# Patient Record
Sex: Male | Born: 1995 | Race: Black or African American | Hispanic: No | Marital: Single | Smoking: Never smoker
Health system: Southern US, Community
[De-identification: ages and names within clinical notes are randomized; demographics above are authoritative.]

---

## 2015-03-18 ENCOUNTER — Emergency Department (INDEPENDENT_AMBULATORY_CARE_PROVIDER_SITE_OTHER)
Admission: EM | Admit: 2015-03-18 | Discharge: 2015-03-18 | Disposition: A | Discharge status: Home / Self Care | Payer: BLUE CROSS/BLUE SHIELD | Source: Home / Self Care | Attending: Family Medicine | Admitting: Family Medicine

## 2015-03-18 ENCOUNTER — Emergency Department (HOSPITAL_COMMUNITY): Payer: BLUE CROSS/BLUE SHIELD

## 2015-03-18 ENCOUNTER — Encounter (HOSPITAL_COMMUNITY): Payer: Self-pay | Admitting: *Deleted

## 2015-03-18 ENCOUNTER — Emergency Department (HOSPITAL_COMMUNITY)
Admission: EM | Admit: 2015-03-18 | Discharge: 2015-03-18 | Disposition: A | Discharge status: Home / Self Care | Payer: BLUE CROSS/BLUE SHIELD | Attending: Emergency Medicine | Admitting: Emergency Medicine

## 2015-03-18 DIAGNOSIS — I861 Scrotal varices: Secondary | ICD-10-CM | POA: Diagnosis not present

## 2015-03-18 DIAGNOSIS — N433 Hydrocele, unspecified: Secondary | ICD-10-CM | POA: Diagnosis not present

## 2015-03-18 DIAGNOSIS — K4090 Unilateral inguinal hernia, without obstruction or gangrene, not specified as recurrent: Secondary | ICD-10-CM | POA: Diagnosis not present

## 2015-03-18 DIAGNOSIS — R1031 Right lower quadrant pain: Secondary | ICD-10-CM | POA: Diagnosis present

## 2015-03-18 LAB — CBC WITH DIFFERENTIAL/PLATELET
Basophils Absolute: 0.1 10*3/uL (ref 0.0–0.1)
Basophils Relative: 0 %
Eosinophils Absolute: 0.1 10*3/uL (ref 0.0–0.7)
Eosinophils Relative: 1 %
HCT: 41.9 % (ref 39.0–52.0)
Hemoglobin: 14.6 g/dL (ref 13.0–17.0)
Lymphocytes Relative: 19 %
Lymphs Abs: 2.4 10*3/uL (ref 0.7–4.0)
MCH: 32.1 pg (ref 26.0–34.0)
MCHC: 34.8 g/dL (ref 30.0–36.0)
MCV: 92.1 fL (ref 78.0–100.0)
Monocytes Absolute: 1 10*3/uL (ref 0.1–1.0)
Monocytes Relative: 8 %
Neutro Abs: 9 10*3/uL — ABNORMAL HIGH (ref 1.7–7.7)
Neutrophils Relative %: 72 %
Platelets: 213 10*3/uL (ref 150–400)
RBC: 4.55 MIL/uL (ref 4.22–5.81)
RDW: 12.5 % (ref 11.5–15.5)
WBC: 12.5 10*3/uL — ABNORMAL HIGH (ref 4.0–10.5)

## 2015-03-18 LAB — BASIC METABOLIC PANEL
Anion gap: 11 (ref 5–15)
BUN: 11 mg/dL (ref 6–20)
CO2: 28 mmol/L (ref 22–32)
Calcium: 9.5 mg/dL (ref 8.9–10.3)
Chloride: 102 mmol/L (ref 101–111)
Creatinine, Ser: 1.09 mg/dL (ref 0.61–1.24)
GFR calc Af Amer: >60 mL/min (ref >60)
GFR calc non Af Amer: >60 mL/min (ref >60)
Glucose, Bld: 92 mg/dL (ref 65–99)
Potassium: 3.7 mmol/L (ref 3.5–5.1)
Sodium: 141 mmol/L (ref 135–145)

## 2015-03-18 MED ORDER — DIAZEPAM 5 MG/ML IJ SOLN
5.0000 mg | Freq: Once | INTRAMUSCULAR | Status: AC
  Administered 2015-03-18: 5 mg via INTRAVENOUS
  Filled 2015-03-18: qty 2

## 2015-03-18 MED ORDER — IBUPROFEN 600 MG PO TABS: 600.0000 mg | ORAL_TABLET | Freq: Four times a day (QID) | ORAL | Status: AC | PRN

## 2015-03-18 MED ORDER — KETOROLAC TROMETHAMINE 15 MG/ML IJ SOLN
15.0000 mg | Freq: Once | INTRAMUSCULAR | Status: AC
  Administered 2015-03-18: 15 mg via INTRAVENOUS
  Filled 2015-03-18: qty 1

## 2015-03-18 MED ORDER — OXYCODONE-ACETAMINOPHEN 5-325 MG PO TABS: 1.0000 | ORAL_TABLET | ORAL | Status: DC | PRN

## 2015-03-18 MED ORDER — MORPHINE SULFATE (PF) 4 MG/ML IV SOLN
6.0000 mg | Freq: Once | INTRAVENOUS | Status: AC
  Administered 2015-03-18: 6 mg via INTRAVENOUS
  Filled 2015-03-18: qty 2

## 2015-03-18 NOTE — ED Notes (Signed)
NAD at this time. Pt is stable and going home.  

## 2015-03-18 NOTE — ED Notes (Signed)
Pt has right groin hernia and it was confirmed at urgent care.  Pt state pain for one week.  Pt sent down here for possible surgical consult

## 2015-03-18 NOTE — Discharge Instructions (Signed)
Hydrocele, Adult A hydrocele is a collection of fluid in the loose pouch of skin that holds the testicles (scrotum). Usually, it affects only one testicle. CAUSES This condition may be caused by:  An injury to the scrotum.  An infection.  A tumor or cancer of the testicle.  Twisting of a testicle.  Decreased blood flow to the scrotum. SYMPTOMS A hydrocele feels like a water-filled balloon. It may also feel heavy. A hydrocele can cause:  Swelling of the scrotum. The swelling may decrease when you lie down.  Swelling of the groin.  Mild discomfort in the scrotum.  Pain. This can develop if the hydrocele was caused by infection or twisting. DIAGNOSIS This condition may be diagnosed with a medical history, physical exam, and imaging tests. You may also have blood and urine tests to check for infection. TREATMENT Treatment may include:  Watching and waiting, particularly if the hydrocele causes no symptoms.  Treatment of the underlying condition. This may include using antibiotic medicine.  Surgery to drain the fluid. Some surgical options include:  Needle aspiration. For this procedure, a needle is used to drain fluid.  Hydrocelectomy. For this procedure, an incision is made in the scrotum to remove the fluid sac. HOME CARE INSTRUCTIONS  Keep all follow-up visits as told by your health care provider. This is important.  Watch the hydrocele for any changes.  Take over-the-counter and prescription medicines only as told by your health care provider.  If you were prescribed an antibiotic medicine, use it as told by your health care provider. Do not stop using the antibiotic even if your condition improves. SEEK MEDICAL CARE IF:  The swelling in your scrotum or groin gets worse.  The hydrocele becomes red, firm, tender to the touch, or painful.  You notice any changes in the hydrocele.  You have a fever.   This information is not intended to replace advice given to  you by your health care provider. Make sure you discuss any questions you have with your health care provider.   Document Released: 11/16/2009 Document Revised: 10/13/2014 Document Reviewed: 05/25/2014 Elsevier Interactive Patient Education 2016 [employer]  Scrotal Swelling Scrotal swelling may occur on one or both sides of the scrotum. Pain may also occur with swelling. Possible causes of scrotal swelling include:   Injury.  Infection.  An ingrown hair or abrasion in the area.  Repeated rubbing from tight-fitting underwear.  Poor hygiene.  A weakened area in the muscles around the groin (hernia). A hernia can allow abdominal contents to push into the scrotum.  Fluid around the testicle (hydrocele).  Enlarged vein around the testicle (varicocele).  Certain medical treatments or existing conditions.  A recent genital surgery or procedure.  The spermatic cord becomes twisted in the scrotum, which cuts off blood supply (testicular torsion).  Testicular cancer. HOME CARE INSTRUCTIONS Once the cause of your scrotal swelling has been determined, you may be asked to monitor your scrotum for any changes. The following actions may help to alleviate any discomfort you are experiencing:  Rest and limit activity until the swelling goes away. Lying down is the preferred position.  Put ice on the scrotum:  Put ice in a plastic bag.  Place a towel between your skin and the bag.  Leave the ice on for 20 minutes, 2-3 times a day for 1-2 days.  Place a rolled towel under the testicles for support.  Wear loose-fitting clothing or an athletic support cup for comfort.  Take all medicines  as directed by your health care provider.  Perform a monthly self-exam of the scrotum and penis. Feel for changes. Ask your health care provider how to perform a monthly self-exam if you are unsure. SEEK MEDICAL CARE IF:  You have a sudden (acute) onset of pain that is persistent and not  improving.  You notice a heavy feeling or fluid in the scrotum.  You have pain or burning while urinating.  You have blood in the urine or semen.  You feel a lump around the testicle.  You notice that one testicle is larger than the other (slight variation is normal).  You have a persistent dull ache or pain in the groin or scrotum. SEEK IMMEDIATE MEDICAL CARE IF:  The pain does not go away or becomes severe.  You have a fever or shaking chills.  You have pain or vomiting that cannot be controlled.  You notice significant redness or swelling of one or both sides of the scrotum.  You experience redness spreading upward from your scrotum to your abdomen or downward from your scrotum to your thighs. MAKE SURE YOU:  Understand these instructions.  Will watch your condition.  Will get help right away if you are not doing well or get worse.   This information is not intended to replace advice given to you by your health care provider. Make sure you discuss any questions you have with your health care provider.   Document Released: 07/01/2010 Document Revised: 01/29/2013 Document Reviewed: 10/31/2012 Elsevier Interactive Patient Education [employer]  Varicocele A varicocele is a swelling of veins in the scrotum. The scrotum is the sac that contains the testicles. Varicoceles can occur on either side of the scrotum, but they are more common on the left side. They occur most often in teenage boys and young men. In most cases, varicoceles are not a serious problem. They are usually small and painless and do not require treatment. Tests may be done to confirm the diagnosis. Treatment may be needed if:  A varicocele is large, causes a lot of pain, or causes pain when exercising.  Varicoceles are found on both sides of the scrotum.  The testicle on the opposite side is absent or not normal.  A varicocele causes a decrease in the size of the testicle in a growing  adolescent.  The person has fertility problems. CAUSES This condition is the result of valves in the veins not working properly. Valves in the veins help to return blood from the scrotum and testicles to the heart. If these valves do not work well, blood flows backward and backs up into the veins, which causes the veins to swell. This is similar to what happens when varicose veins form in the leg. SYMPTOMS Most varicoceles do not cause any symptoms. If symptoms do occur, they may include:  Swelling on one side of the scrotum. The swelling may be more obvious when you are standing up.  A lumpy feeling in the scrotum.  A heavy feeling on one side of the scrotum.  A dull ache in the scrotum, especially after exercise or prolonged standing or sitting.  Slower growth or reduced size of the testicle on the side of the varicocele (in young males).  Problems with fertility. These can occur if the testicle does not grow normally. DIAGNOSIS This condition may be diagnosed with a physical exam. You may also have an imaging test, called an ultrasound, to confirm the diagnosis and to help rule out other causes  of the swelling. TREATMENT Treatment is usually not needed for this condition. If you have any pain, your health care provider may prescribe or recommend medicine to help relieve it. You may need regular exams so your health care provider can monitor the varicocele to ensure that it does not cause problems. When further treatment is needed, it may involve one of these options:  Varicocelectomy. This is a surgery in which the swollen veins are tied off so that the flow of blood goes to other veins instead.  Embolization. In this procedure, a small tube (catheter) is used to place metal coils or other blocking items in the veins. This cuts off the blood flow to the swollen veins. HOME CARE INSTRUCTIONS  Take medicines only as directed by your health care provider.  Wear supportive  underwear.  Use an athletic supporter for sports.  Keep all follow-up visits as directed by your health care provider. This is important. SEEK MEDICAL CARE IF:  Your pain is increasing.  You have redness in the affected area.  You have swelling that does not decrease when you are lying down.  One of your testicles is smaller than the other.  Your testicle becomes enlarged, swollen, or painful.   This information is not intended to replace advice given to you by your health care provider. Make sure you discuss any questions you have with your health care provider.   Document Released: 09/04/2000 Document Revised: 10/13/2014 Document Reviewed: 05/06/2014 Elsevier Interactive Patient Education [employer]

## 2015-03-18 NOTE — ED Notes (Signed)
Pt reports right groin pain the past week.  He usually lifts weights and thinks he may have injured himself recently.   He denies urinary problems

## 2015-03-18 NOTE — ED Provider Notes (Signed)
CSN: [id number]     Arrival date & [id number]  1559 History   First MD Initiated Contact with Patient 03/18/15 1611     Chief Complaint  Patient presents with  . Inguinal Hernia     (Consider location/radiation/quality/duration/timing/severity/associated sxs/prior Treatment) HPI  19 year old male with right to this particular/right inguinal pain. Onset about a week ago. Progressively worsening. Patient was seen in urgent care prior to arrival referred to the emergency room. His pain at rest which is worse with movement and coughing. No urinary complaints. No fevers or chills. No history of similar type symptoms. Has tried taking ibuprofen with minimal relief.  History reviewed. No pertinent past medical history. History reviewed. No pertinent past surgical history. No family history on file. Social History  Substance Use Topics  . Smoking status: Never Smoker   . Smokeless tobacco: Never Used  . Alcohol Use: No    Review of Systems  All systems reviewed and negative, other than as noted in HPI.   Allergies  Review of patient's allergies indicates no known allergies.  Home Medications   Prior to Admission medications   Not on File   BP 156/87 mmHg  Pulse 64  Temp(Src) 98.3 F (36.8 C) (Oral)  Resp 18  SpO2 100% Physical Exam  Constitutional: He appears well-developed and well-nourished. No distress.  HENT:  Head: Normocephalic and atraumatic.  Eyes: Conjunctivae are normal. Right eye exhibits no discharge. Left eye exhibits no discharge.  Neck: Neck supple.  Cardiovascular: Normal rate, regular rhythm and normal heart sounds.  Exam reveals no gallop and no friction rub.   No murmur heard. Pulmonary/Chest: Effort normal and breath sounds normal. No respiratory distress.  Abdominal: Soft. He exhibits no distension. There is no tenderness.  Genitourinary:  Fullness of the right hemiscrotum is tenderness to palpation. Tenderness along the right inguinal canal. The  right lower quadrant tenderness. No discharge. No inguinal adenopathy.  Musculoskeletal: He exhibits no edema or tenderness.  Neurological: He is alert.  Skin: Skin is warm and dry.  Psychiatric: He has a normal mood and affect. His behavior is normal. Thought content normal.  Nursing note and vitals reviewed.   ED Course  Procedures (including critical care time) Labs Review Labs Reviewed  CBC WITH DIFFERENTIAL/PLATELET - Abnormal; Notable for the following:    WBC 12.5 (*)    Neutro Abs 9.0 (*)    All other components within normal limits  BASIC METABOLIC PANEL  URINALYSIS, ROUTINE W REFLEX MICROSCOPIC (NOT AT [facility])    Imaging Review US Scrotum  03/18/2015   CLINICAL DATA:  Right testicular pain for 3-4 days, swelling.  EXAM: ULTRASOUND OF SCROTUM  TECHNIQUE: Complete ultrasound examination of the testicles, epididymis, and other scrotal structures was performed.  COMPARISON:  None.  FINDINGS: Right testicle  Measurements: 3.6 x 2.1 x 2.8 cm. No mass or microlithiasis visualized.  Left testicle  Measurements: 3.4 x 2 x 2.3 cm. No mass or microlithiasis visualized.  Right epididymis:  Normal in size and appearance.  Left epididymis:  Normal in size and appearance.  Hydrocele: Moderate-sized right hydrocele. No left-sided hydrocele.  Varicocele:  Large right-sided varicocele.  IMPRESSION: 1. Large right-sided varicocele. 2. Moderate-sized right hydrocele. 3. Both testicles appear normal. No evidence of testicular torsion or orchitis. 4. No evidence of epididymitis.   Electronically Signed   By: Bary Richard M.D.   On: 03/18/2015 18:00   I have personally reviewed and evaluated these images and lab results as part of my  medical decision-making.   EKG Interpretation None      MDM   Final diagnoses:  Right varicocele  Hydrocele, right    19 year old male with right inguinal/testicular pain. Seen at [facility] prior to arrival and sent to ED for irreducible R inguinal hernia. He has a  fullness along the right inguinal crease. He is extremely apprehensive on exam. R testicle seems high and very tender. No obstructive symptoms. No concerning overlying skin changes. Will place IV for pain medication and anxiolysis attempt to reexamine. Consult surgery.  Surgery seen pt. Concerned for possible testicular torsion. Will [place].   Ultrasensitive significant for right-sided hydrocele and varicocele. Workup otherwise unremarkable. Treatment. Urology follow-up.    Raeford Razor, MD 03/25/15 1052

## 2015-03-18 NOTE — ED Provider Notes (Signed)
CSN: [id number]     Arrival date & time 03/18/15  1421 History   First MD Initiated Contact with Patient 03/18/15 1515     Chief Complaint  Patient presents with  . Hernia   (Consider location/radiation/quality/duration/timing/severity/associated sxs/prior Treatment) HPI  Dead lifting when pt noticed an acute onset RLQ pain w/ radiation to R testicle. Minimal pain at first but became progressively stronger. Enlarged R testicle/mass. Occurred 7 days ago.RLQ w/ radiation to R testicle. No previous testicular pain or hernia history. 400 mg Advil with minimal improvement. Pain is getting worse. Change in bowel habits. Patient had an uncle with a hernia but no other hernia history. Patient has not been any further weight lifting since onset of symptoms.    History reviewed. No pertinent past medical history. History reviewed. No pertinent past surgical history. No family history on file. Social History  Substance Use Topics  . Smoking status: Never Smoker   . Smokeless tobacco: Never Used  . Alcohol Use: No    Review of Systems Per HPI with all other pertinent systems negative.   Allergies  Review of patient's allergies indicates no known allergies.  Home Medications   Prior to Admission medications   Not on File   Meds Ordered and Administered this Visit  Medications - No data to display  BP 148/96 mmHg  Pulse 65  Temp(Src) 99 F (37.2 C) (Oral)  Resp 16  SpO2 100% No data found.   Physical Exam Physical Exam  Constitutional: oriented to person, place, and time. appears well-developed and well-nourished. No distress.  HENT:  Head: Normocephalic and atraumatic.  Eyes: EOMI. PERRL.  Neck: Normal range of motion.  Cardiovascular: RRR, no m/r/g, 2+ distal pulses,  Pulmonary/Chest: Effort normal and breath sounds normal. No respiratory distress.  Abdominal: Soft. Bowel sounds are normal. NonTTP, no distension.  Musculoskeletal: Normal range of motion. Non ttp, no  effusion.  Neurological: alert and oriented to person, place, and time.  Skin: Skin is warm. No rash noted. non diaphoretic.  Psychiatric: normal mood and affect. behavior is normal. Judgment and thought content normal.  GU:  Right testicle and inguinal canal with large mass likely loops of bowel that are nonreducible. Tender to palpation.  ED Course  Procedures (including critical care time)  Labs Review Labs Reviewed - No data to display  Imaging Review No results found.   Visual Acuity Review  Right Eye Distance:   Left Eye Distance:   Bilateral Distance:    Right Eye Near:   Left Eye Near:    Bilateral Near:         MDM   1. Right inguinal hernia    Nonreducible right inguinal hernia. Problem is constant and getting worse. Do not suspect bowel is ischemic at this point time as it has been ongoing for a week but believe patient needs further evaluation in the emergency room and possible evaluation for surgical repair. Discussed case with on-call surgical PA Barnetta Chapel who agrees with sending patient to the ED for further evaluation.    Ozella Rocks, MD 03/18/15 [phone]

## 2015-03-18 NOTE — Consult Note (Signed)
Reason for Consult:Right groin pain Referring Physician: Juleen China MD   Robert Cooke is an 19 y.o. male.  HPI: 3 - 4 DAY history of right groin / testicle pain that worsened today and is now severe.  Hurts to push on right testicle and along the cord on the left.  Says he had had some swelling for the last 5 days or so.  No change in bowel or bladder function.    History reviewed. No pertinent past medical history.  History reviewed. No pertinent past surgical history.  No family history on file.  Social History:  reports that he has never smoked. He has never used smokeless tobacco. He reports that he does not drink alcohol or use illicit drugs.  Allergies: No Known Allergies  Medications: I have reviewed the patient's current medications.  No results found for this or any previous visit (from the past 48 hour(s)).  No results found.  Review of Systems  Constitutional: Negative for fever and chills.  HENT: Negative.   Eyes: Negative.   Respiratory: Negative.   Cardiovascular: Negative.   Gastrointestinal: Negative.   Musculoskeletal: Negative.   Skin: Negative.   Neurological: Negative.   Psychiatric/Behavioral: Negative.    Blood pressure 156/87, pulse 64, temperature 98.3 F (36.8 C), temperature source Oral, resp. rate 18, SpO2 100 %. Physical Exam  Constitutional: He appears well-developed and well-nourished.  HENT:  Head: Normocephalic and atraumatic.  Eyes: Pupils are equal, round, and reactive to light.  Neck: Normal range of motion.  Cardiovascular: Normal rate.   Respiratory: Effort normal.  GI: Soft. He exhibits no distension. There is no tenderness. Hernia confirmed negative in the right inguinal area and confirmed negative in the left inguinal area.  Genitourinary: Penis normal.    Right testis shows swelling and tenderness. Left testis shows no mass, no swelling and no tenderness. Left testis is descended. Cremasteric reflex is not absent on the  left side. Circumcised.  Lymphadenopathy:       Right: No inguinal adenopathy present.       Left: No inguinal adenopathy present.    Assessment/Plan: Right testicular pain/ swelling  Recommend U/S to exclude torsion since right testicle seem high than the left.  I don't feel a large incarcerated hernia on exam.  Very painful around right cord structures.  May need CT to sort out if U/S unrevealing.    Robert Cooke A. 03/18/2015, 4:45 PM

## 2015-03-30 ENCOUNTER — Encounter (HOSPITAL_COMMUNITY): Payer: Self-pay | Admitting: Emergency Medicine

## 2015-03-30 ENCOUNTER — Emergency Department (HOSPITAL_COMMUNITY): Payer: BLUE CROSS/BLUE SHIELD

## 2015-03-30 ENCOUNTER — Emergency Department (HOSPITAL_COMMUNITY)
Admission: EM | Admit: 2015-03-30 | Discharge: 2015-03-30 | Disposition: A | Discharge status: Home / Self Care | Payer: BLUE CROSS/BLUE SHIELD | Attending: Emergency Medicine | Admitting: Emergency Medicine

## 2015-03-30 DIAGNOSIS — I861 Scrotal varices: Secondary | ICD-10-CM | POA: Diagnosis not present

## 2015-03-30 DIAGNOSIS — N50811 Right testicular pain: Secondary | ICD-10-CM | POA: Diagnosis present

## 2015-03-30 DIAGNOSIS — N451 Epididymitis: Secondary | ICD-10-CM

## 2015-03-30 DIAGNOSIS — Z791 Long term (current) use of non-steroidal anti-inflammatories (NSAID): Secondary | ICD-10-CM | POA: Diagnosis not present

## 2015-03-30 DIAGNOSIS — N50819 Testicular pain, unspecified: Secondary | ICD-10-CM

## 2015-03-30 MED ORDER — OXYCODONE HCL 5 MG PO TABS
5.0000 mg | ORAL_TABLET | Freq: Once | ORAL | Status: AC
  Administered 2015-03-30: 5 mg via ORAL
  Filled 2015-03-30: qty 1

## 2015-03-30 MED ORDER — IBUPROFEN 800 MG PO TABS
800.0000 mg | ORAL_TABLET | Freq: Once | ORAL | Status: AC
  Administered 2015-03-30: 800 mg via ORAL
  Filled 2015-03-30: qty 1

## 2015-03-30 MED ORDER — OXYCODONE HCL 5 MG PO TABS: 5.0000 mg | ORAL_TABLET | ORAL | Status: AC | PRN

## 2015-03-30 MED ORDER — ACETAMINOPHEN 500 MG PO TABS
1000.0000 mg | ORAL_TABLET | Freq: Once | ORAL | Status: AC
  Administered 2015-03-30: 1000 mg via ORAL
  Filled 2015-03-30: qty 2

## 2015-03-30 MED ORDER — CIPROFLOXACIN HCL 500 MG PO TABS: 500.0000 mg | ORAL_TABLET | Freq: Two times a day (BID) | ORAL | Status: AC

## 2015-03-30 NOTE — ED Notes (Signed)
Pt returned from US, pain decreased

## 2015-03-30 NOTE — ED Provider Notes (Signed)
CSN: [id number]     Arrival date & time 03/30/15  1134 History   First MD Initiated Contact with Patient 03/30/15 1259     Chief Complaint  Patient presents with  . Testicle Pain     (Consider location/radiation/quality/duration/timing/severity/associated sxs/prior Treatment) Patient is a 19 y.o. male presenting with male genitourinary complaint. The history is provided by the patient.  Male GU Problem Presenting symptoms: scrotal pain   Context: spontaneously   Relieved by:  Nothing Worsened by:  Nothing tried Ineffective treatments:  None tried Associated symptoms: scrotal swelling   Associated symptoms: no abdominal pain, no diarrhea, no fever and no vomiting     19 yo M  With a chief complaint of right testicular swelling. This been going on for at least 3 weeks. Patient was seen a couple weeks ago in the emergency department with the testicular ultrasound concerning for a  Varicocele,  discharge home with urology follow-up. patient has continued to have pain has been wearing compressive briefs as well as taking narcotics and ibuprofen with minimal relief. Patient is running out of his medicine in the family is concerned about continued pain. follow-up with urology later this month.  .History reviewed. No pertinent past medical history. History reviewed. No pertinent past surgical history. History reviewed. No pertinent family history. Social History  Substance Use Topics  . Smoking status: Never Smoker   . Smokeless tobacco: Never Used  . Alcohol Use: No    Review of Systems  Constitutional: Negative for fever and chills.  HENT: Negative for congestion and facial swelling.   Eyes: Negative for discharge and visual disturbance.  Respiratory: Negative for shortness of breath.   Cardiovascular: Negative for chest pain and palpitations.  Gastrointestinal: Negative for vomiting, abdominal pain and diarrhea.  Genitourinary: Positive for scrotal swelling and testicular pain.   Musculoskeletal: Negative for myalgias and arthralgias.  Skin: Negative for color change and rash.  Neurological: Negative for tremors, syncope and headaches.  Psychiatric/Behavioral: Negative for confusion and dysphoric mood.      Allergies  Review of patient's allergies indicates no known allergies.  Home Medications   Prior to Admission medications   Medication Sig Start Date End Date Taking? Authorizing Provider  ibuprofen (ADVIL,MOTRIN) 600 MG tablet Take 1 tablet (600 mg total) by mouth every 6 (six) hours as needed. 03/18/15  Yes Raeford Razor, MD  ciprofloxacin (CIPRO) 500 MG tablet Take 1 tablet (500 mg total) by mouth 2 (two) times daily. 03/30/15   Melene Plan, DO  oxyCODONE (ROXICODONE) 5 MG immediate release tablet Take 1 tablet (5 mg total) by mouth every 4 (four) hours as needed for severe pain. 03/30/15   Melene Plan, DO   BP 140/93 mmHg  Pulse 76  Temp(Src) 98.4 F (36.9 C) (Oral)  Resp 16  Ht  (1.803 m)  Wt 177 lb 12.8 oz (80.65 kg)  BMI 24.81 kg/m2  SpO2 100% Physical Exam  Constitutional: He is oriented to person, place, and time. He appears well-developed and well-nourished.  HENT:  Head: Normocephalic and atraumatic.  Eyes: EOM are normal. Pupils are equal, round, and reactive to light.  Neck: Normal range of motion. Neck supple. No JVD present.  Cardiovascular: Normal rate and regular rhythm.  Exam reveals no gallop and no friction rub.   No murmur heard. Pulmonary/Chest: No respiratory distress. He has no wheezes.  Abdominal: He exhibits no distension. There is no tenderness. There is no rebound and no guarding.  Genitourinary: Right testis shows swelling and  tenderness. Cremasteric reflex is absent on the right side. Cremasteric reflex is not absent on the left side. Circumcised. No penile tenderness. No discharge found.   Significant swelling and abnormal lie of the right testicle.  No noted cremasteric reflex on that side  Musculoskeletal: Normal  range of motion.  Neurological: He is alert and oriented to person, place, and time.  Skin: No rash noted. No pallor.  Psychiatric: He has a normal mood and affect. His behavior is normal.  Nursing note and vitals reviewed.   ED Course  Procedures (including critical care time) Labs Review Labs Reviewed  URINALYSIS, ROUTINE W REFLEX MICROSCOPIC (NOT AT [facility])  GC/CHLAMYDIA PROBE AMP ([place]) NOT AT [facility]    Imaging Review US Scrotum  03/30/2015  CLINICAL DATA:  Right testicular pain and swelling 2 weeks EXAM: SCROTAL ULTRASOUND DOPPLER ULTRASOUND OF THE TESTICLES TECHNIQUE: Complete ultrasound examination of the testicles, epididymis, and other scrotal structures was performed. Color and spectral Doppler ultrasound were also utilized to evaluate blood flow to the testicles. COMPARISON:  Scrotal ultrasound 03/18/2015 FINDINGS: Right testicle Measurements: 3.4 x 2.5 x 2.7 cm. No mass or microlithiasis visualized. Left testicle Measurements: 3.8 x 1.6 x 2.8 cm. No mass or microlithiasis visualized. Right epididymis: The right epididymis is difficult to separate from a large soft tissue abnormality extending up to the inguinal canal. This is felt to be most likely related to varicocele. This shows hypervascularity but does not change significantly on Valsalva. Epididymal infection or tumor considered less likely. Left epididymis:  Normal in size and appearance. Hydrocele: Large septated hydrocele on the right has enlarged in the interval. Varicocele: Hypervascular mass in the right scrotum above the testicle extending into the inguinal canal. This shows serpiginous enlarged vessels but does not change significantly with Valsalva. This most likely is a varicocele. Hypervascular inflammatory or neoplastic mass of the epididymis considered less likely. Similar appearance to the prior ultrasound. Correlate with physical exam. Pulsed Doppler interrogation of both testes demonstrates normal low resistance  arterial and venous waveforms bilaterally. IMPRESSION: Progression of hydrocele on the right. Negative for testicular torsion Soft tissue thickening above the right testicle extending into the inguinal canal shows hypervascularity and prominent enlarged vessels but does not change significantly with Valsalva. This may represent varicocele possibly with partial thrombosis and tissue edema. Epididymal infection or tumor considered less likely. Electronically Signed   By: Marlan Palau M.D.   On: 03/30/2015 14:46   [place] Art/ven Flow Abd Pelv Doppler  03/30/2015  CLINICAL DATA:  Right testicular pain and swelling 2 weeks EXAM: SCROTAL ULTRASOUND DOPPLER ULTRASOUND OF THE TESTICLES TECHNIQUE: Complete ultrasound examination of the testicles, epididymis, and other scrotal structures was performed. Color and spectral Doppler ultrasound were also utilized to evaluate blood flow to the testicles. COMPARISON:  Scrotal ultrasound 03/18/2015 FINDINGS: Right testicle Measurements: 3.4 x 2.5 x 2.7 cm. No mass or microlithiasis visualized. Left testicle Measurements: 3.8 x 1.6 x 2.8 cm. No mass or microlithiasis visualized. Right epididymis: The right epididymis is difficult to separate from a large soft tissue abnormality extending up to the inguinal canal. This is felt to be most likely related to varicocele. This shows hypervascularity but does not change significantly on Valsalva. Epididymal infection or tumor considered less likely. Left epididymis:  Normal in size and appearance. Hydrocele: Large septated hydrocele on the right has enlarged in the interval. Varicocele: Hypervascular mass in the right scrotum above the testicle extending into the inguinal canal. This shows serpiginous enlarged vessels but does  not change significantly with Valsalva. This most likely is a varicocele. Hypervascular inflammatory or neoplastic mass of the epididymis considered less likely. Similar appearance to the prior ultrasound. Correlate  with physical exam. Pulsed Doppler interrogation of both testes demonstrates normal low resistance arterial and venous waveforms bilaterally. IMPRESSION: Progression of hydrocele on the right. Negative for testicular torsion Soft tissue thickening above the right testicle extending into the inguinal canal shows hypervascularity and prominent enlarged vessels but does not change significantly with Valsalva. This may represent varicocele possibly with partial thrombosis and tissue edema. Epididymal infection or tumor considered less likely. Electronically Signed   By: Marlan Palauharles  Clark M.D.   On: 03/30/2015 14:46   I have personally reviewed and evaluated these images and lab results as part of my medical decision-making.   EKG Interpretation None      MDM   Final diagnoses:  Testicular pain  Varicocele  Epididymitis    19 yo M,  With a chief complaint of right testicular pain.  Exam concerning for possible torsion will reultrasound.    ultrasound negative for torsion. Persistent varicocele as well as increasing hydrocele.  Also some concern for possible clot. Discussed these results with Dr. Marlou PorchHerrick,  Urology. Concern this may be epididymitis. Will treat with Cipro for 2 weeks. Have the patient call on Friday , if not improved stable see him in the office.  Discussed  With patient concerns for possible  Tendon rupture. Will abstain from athletics or weightlifting during antibiotic use.   I have discussed the diagnosis/risks/treatment options with the patient and family and believe the pt to be eligible for discharge home to follow-up with Urologist. We also discussed returning to the ED immediately if new or worsening sx occur. We discussed the sx which are most concerning (e.g., sudden worsening pain, fever, inability to tolerate by mouth) that necessitate immediate return. Medications administered to the patient during their visit and any new prescriptions provided to the patient are listed  below.  Medications given during this visit Medications  acetaminophen (TYLENOL) tablet 1,000 mg (1,000 mg Oral Given 03/30/15 1323)  ibuprofen (ADVIL,MOTRIN) tablet 800 mg (800 mg Oral Given 03/30/15 1323)  oxyCODONE (Oxy IR/ROXICODONE) immediate release tablet 5 mg (5 mg Oral Given 03/30/15 1323)    Discharge Medication List as of 03/30/2015  3:17 PM    START taking these medications   Details  ciprofloxacin (CIPRO) 500 MG tablet Take 1 tablet (500 mg total) by mouth 2 (two) times daily., Starting 03/30/2015, Until Discontinued, Print    oxyCODONE (ROXICODONE) 5 MG immediate release tablet Take 1 tablet (5 mg total) by mouth every 4 (four) hours as needed for severe pain., Starting 03/30/2015, Until Discontinued, Print        The patient appears reasonably screen and/or stabilized for discharge and I doubt any other medical condition or other [facility] requiring further screening, evaluation, or treatment in the ED at this time prior to discharge.     Melene Planan Alianah Lofton, DO 03/30/15 1728

## 2015-03-30 NOTE — ED Notes (Signed)
Patient transported to Ultrasound 

## 2015-03-30 NOTE — ED Notes (Signed)
Pt sts right testicle pain and was diagnosed with hydrocele and varicocele; pt sts pain x 3 weeks

## 2015-03-30 NOTE — Discharge Instructions (Signed)
Take 4 over the counter ibuprofen tablets 3 times a day or 2 over-the-counter naproxen tablets twice a day for pain.  Epididymitis Epididymitis is swelling (inflammation) of the epididymis. The epididymis is a cord-like structure that is located along the top and back part of the testicle. It collects and stores sperm from the testicle. This condition can also cause pain and swelling of the testicle and scrotum. Symptoms usually start suddenly (acute epididymitis). Sometimes epididymitis starts gradually and lasts for a while (chronic epididymitis). This type may be harder to treat. CAUSES In men 46 and younger, this condition is usually caused by a bacterial infection or sexually transmitted disease (STD), such as:  Gonorrhea.  Chlamydia.  In men 57 and older who do not have anal sex, this condition is usually caused by bacteria from a blockage or abnormalities in the urinary system. These can result from:  Having a tube placed into the bladder (urinary catheter).  Having an enlarged or inflamed prostate gland.  Having recent urinary tract surgery. In men who have a condition that weakens the body's defense system (immune system), such as HIV, this condition can be caused by:   Other bacteria, including tuberculosis and syphilis.  Viruses.  Fungi. Sometimes this condition occurs without infection. That may happen if urine flows backward into the epididymis after heavy lifting or straining. RISK FACTORS This condition is more likely to develop in men:  Who have unprotected sex with more than one partner.  Who have anal sex.   Who have recently had surgery.   Who have a urinary catheter.  Who have urinary problems.  Who have a suppressed immune system. SYMPTOMS  This condition usually begins suddenly with chills, fever, and pain behind the scrotum and in the testicle. Other symptoms include:   Swelling of the scrotum, testicle, or both.  Pain whenejaculatingor  urinating.  Pain in the back or belly.  Nausea.  Itching and discharge from the penis.  Frequent need to pass urine.  Redness and tenderness of the scrotum. DIAGNOSIS Your health care provider can diagnose this condition based on your symptoms and medical history. Your health care provider will also do a physical exam to ask about your symptoms and check your scrotum and testicle for swelling, pain, and redness. You may also have other tests, including:   Examination of discharge from the penis.  Urine tests for infections, such as STDs.  Your health care provider may test you for other STDs, including HIV. TREATMENT Treatment for this condition depends on the cause. If your condition is caused by a bacterial infection, oral antibiotic medicine may be prescribed. If the bacterial infection has spread to your blood, you may need to receive IV antibiotics. Nonbacterial epididymitis is treated with home care that includes bed rest and elevation of the scrotum. Surgery may be needed to treat:  Bacterial epididymitis that causes pus to build up in the scrotum (abscess).  Chronic epididymitis that has not responded to other treatments. HOME CARE INSTRUCTIONS Medicines  Take over-the-counter and prescription medicines only as told by your health care provider.   If you were prescribed an antibiotic medicine, take it as told by your health care provider. Do not stop taking the antibiotic even if your condition improves. Sexual Activity  If your epididymitis was caused by an STD, avoid sexual activity until your treatment is complete.  Inform your sexual partner or partners if you test positive for an STD. They may need to be treated.Do not engage in  sexual activity with your partner or partners until their treatment is completed. General Instructions  Return to your normal activities as told by your health care provider. Ask your health care provider what activities are safe for  you.  Keep your scrotum elevated and supported while resting. Ask your health care provider if you should wear a scrotal support, such as a jockstrap. Wear it as told by your health care provider.  If directed, apply ice to the affected area:   Put ice in a plastic bag.  Place a towel between your skin and the bag.  Leave the ice on for 20 minutes, 2-3 times per day.  Try taking a sitz bath to help with discomfort. This is a warm water bath that is taken while you are sitting down. The water should only come up to your hips and should cover your buttocks. Do this 3-4 times per day or as told by your health care provider.  Keep all follow-up visits as told by your health care provider. This is important. SEEK MEDICAL CARE IF:   You have a fever.   Your pain medicine is not helping.   Your pain is getting worse.   Your symptoms do not improve within three days.   This information is not intended to replace advice given to you by your health care provider. Make sure you discuss any questions you have with your health care provider.   Document Released: 05/26/2000 Document Revised: 02/17/2015 Document Reviewed: 10/14/2014 Elsevier Interactive Patient Education [employer]2016 [employer]

## 2016-04-10 IMAGING — US US SCROTUM
1 series · 14 of 25 positions shown · non-contrast
Comparison: None.

CLINICAL DATA: Right testicular pain for 3-4 days, swelling.

EXAM:
ULTRASOUND OF SCROTUM
TECHNIQUE: Complete ultrasound examination of the testicles, epididymis, and
other scrotal structures was performed.

[Series 1: us scrotum · 0.07mm/px · 14 of 64 slices shown]
[im 1/64]
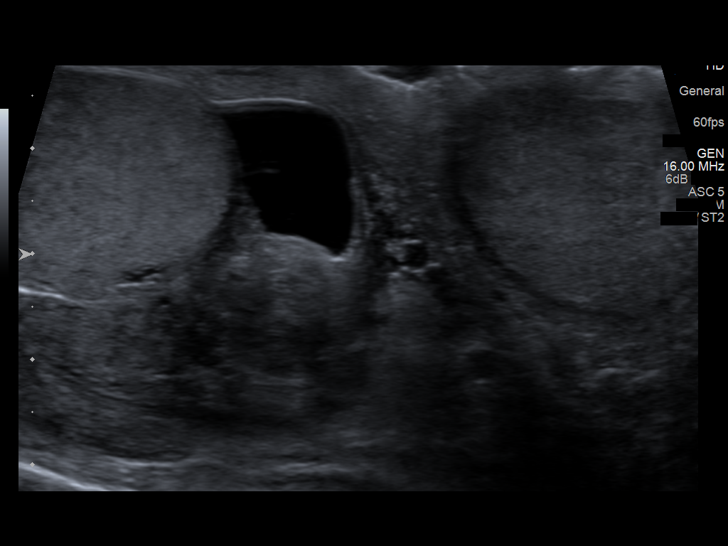
[im 6/64]
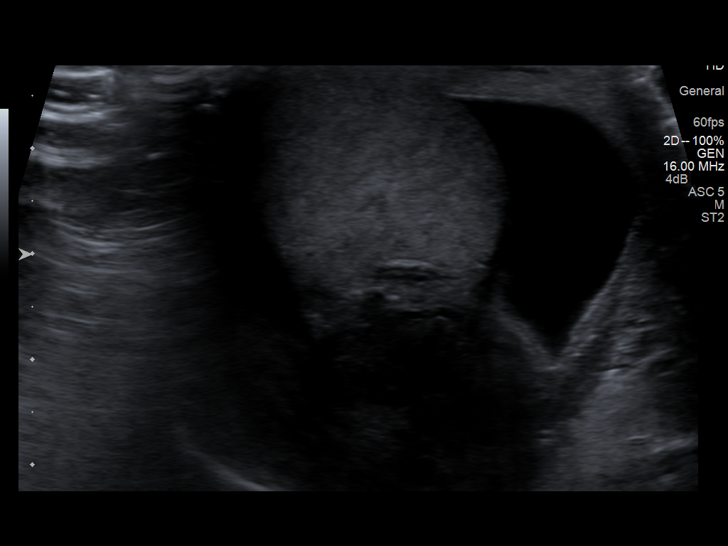
[im 11/64]
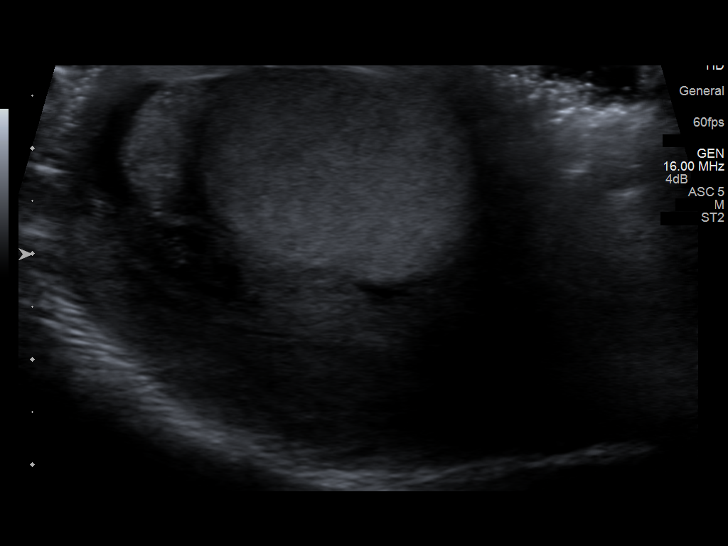
[im 16/64]
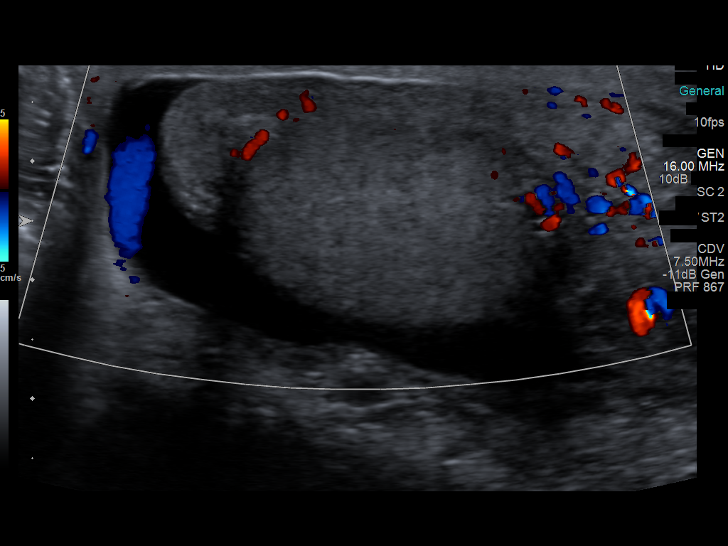
[im 22/64]
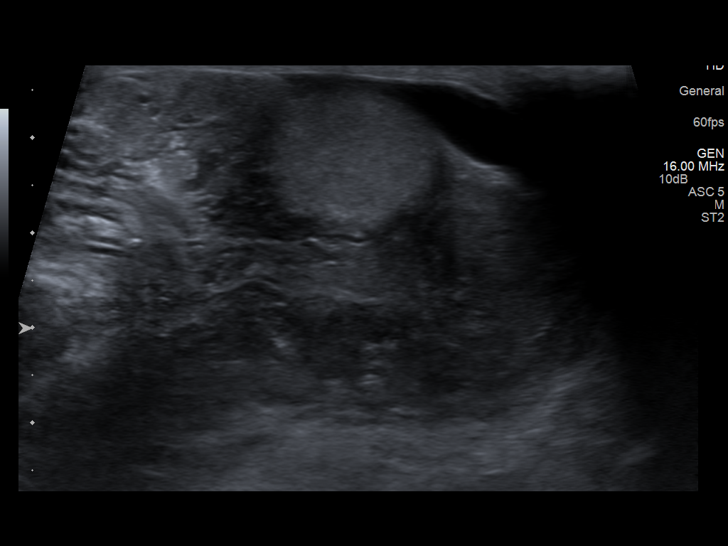
[im 24/64]
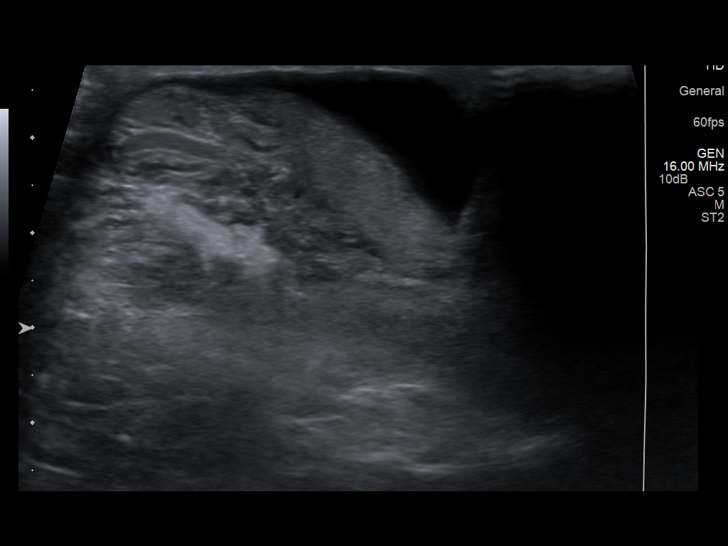
[im 29/64]
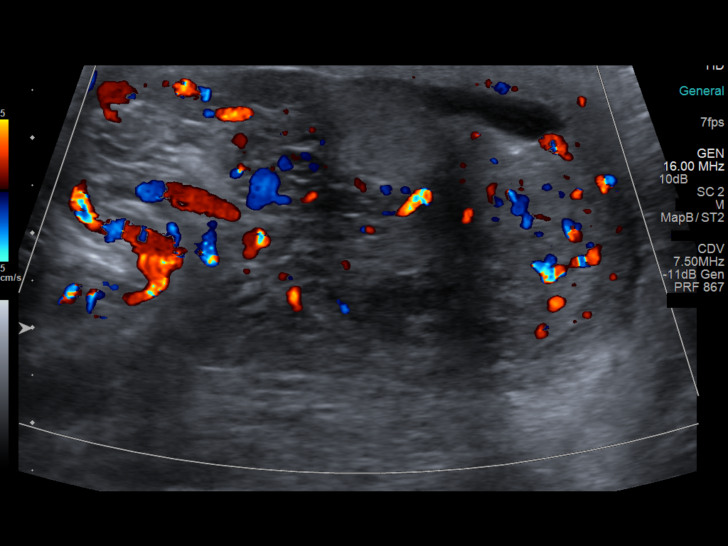
[im 35/64]
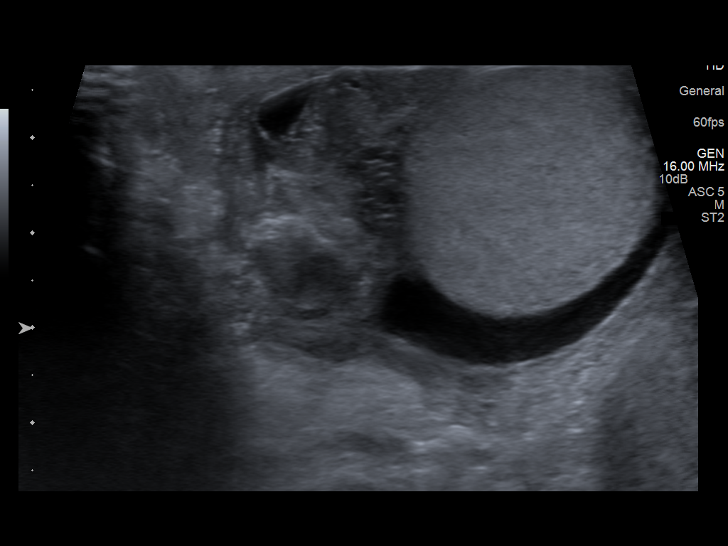
[im 40/64]
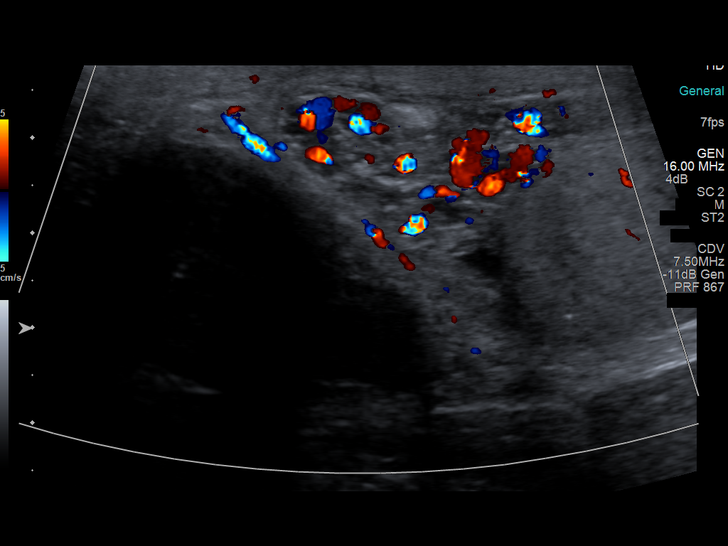
[im 43/64]
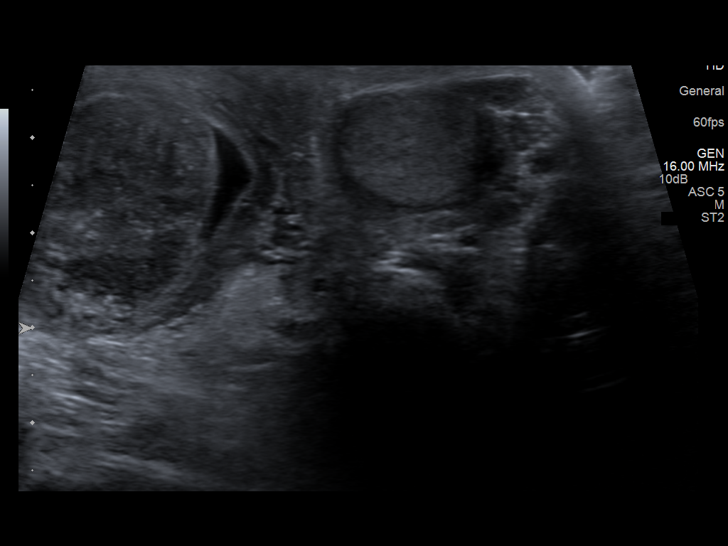
[im 48/64]
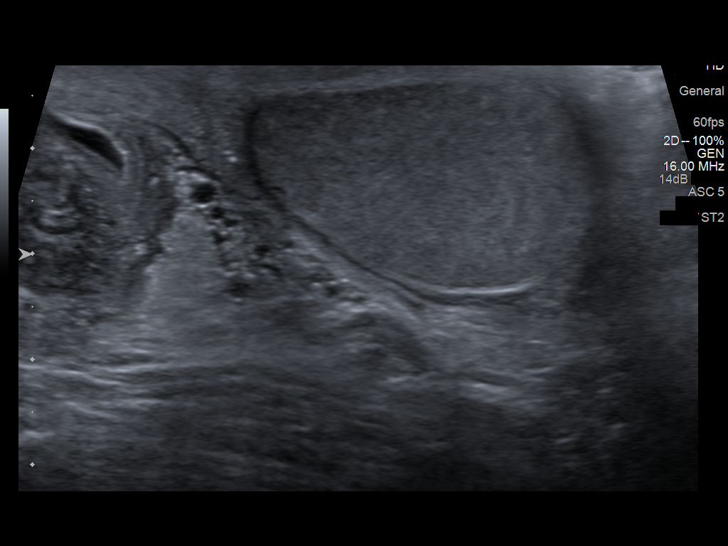
[im 53/64]
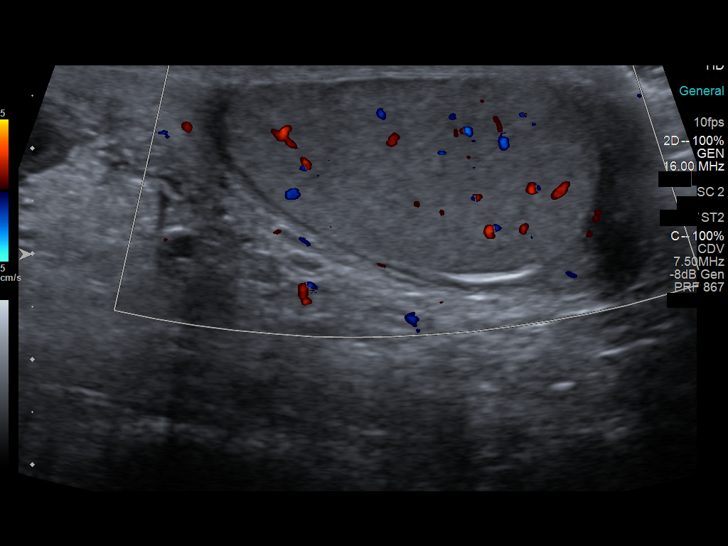
[im 58/64]
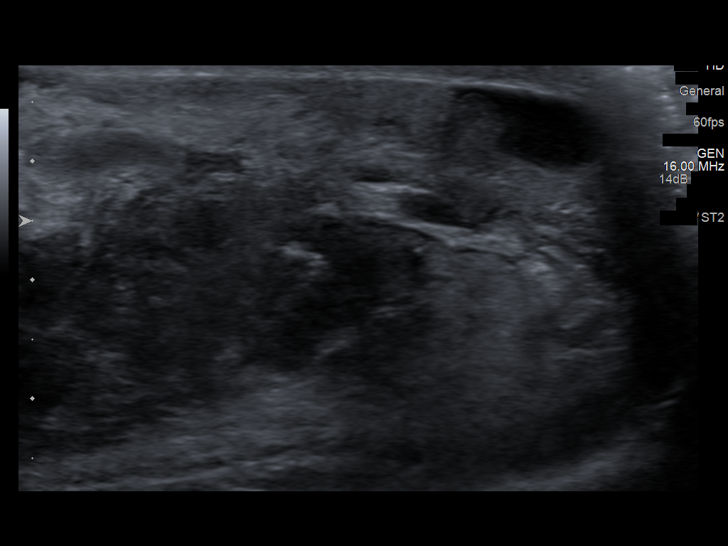
[im 64/64]
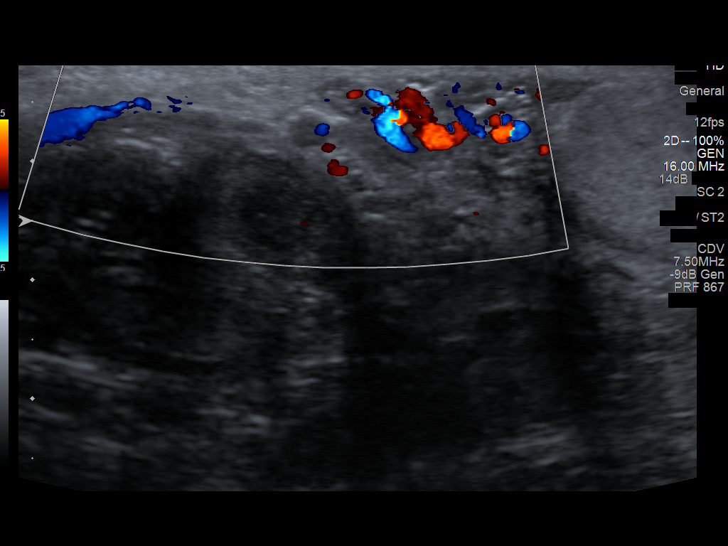

[14 of 25 positions shown; findings below may reference images not displayed]

FINDINGS: Right testicle

Measurements: 3.6 x 2.1 x 2.8 cm. No mass or microlithiasis
visualized.

Left testicle

Measurements: 3.4 x 2 x 2.3 cm. No mass or microlithiasis
visualized.

Right epididymis:  Normal in size and appearance.

Left epididymis:  Normal in size and appearance.

Hydrocele: Moderate-sized right hydrocele. No left-sided hydrocele.

Varicocele:  Large right-sided varicocele.
IMPRESSION: 1. Large right-sided varicocele.
2. Moderate-sized right hydrocele.
3. Both testicles appear normal. No evidence of testicular torsion
or orchitis.
4. No evidence of epididymitis.

## 2016-04-22 IMAGING — US US SCROTUM
1 series · 13 of 25 positions shown · non-contrast
Comparison: Scrotal ultrasound 03/18/2015

CLINICAL DATA: Right testicular pain and swelling 2 weeks

EXAM:
SCROTAL ULTRASOUND
DOPPLER ULTRASOUND OF THE TESTICLES
TECHNIQUE: Complete ultrasound examination of the testicles, epididymis, and
other scrotal structures was performed. Color and spectral Doppler
ultrasound were also utilized to evaluate blood flow to the
testicles.

[Series 1: us scrotum · 0.06mm/px · 60 acquisitions, 13 frames shown]
[im 1/60]
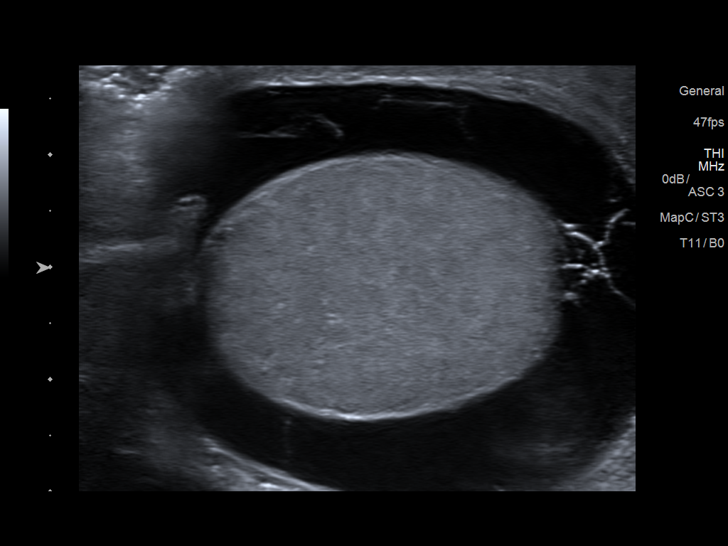
[im 5/60]
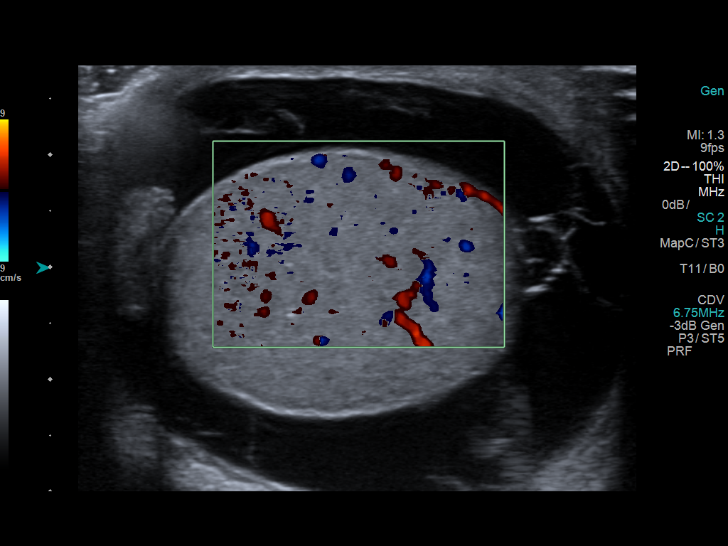
[im 10/60]
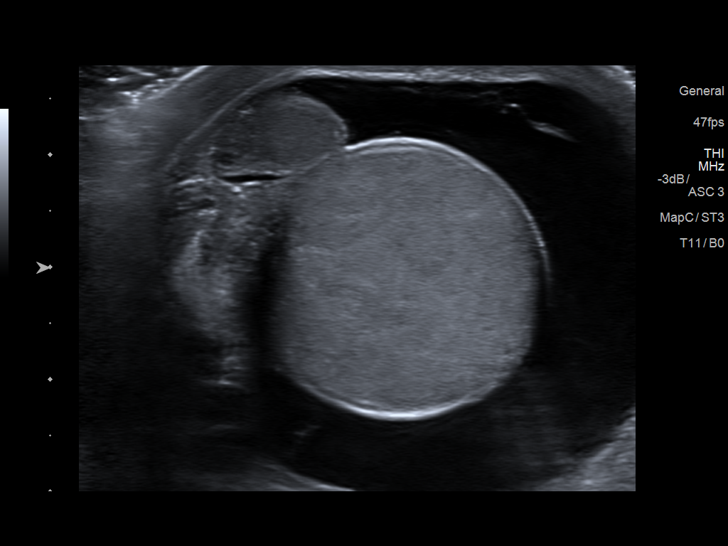
[im 15/60]
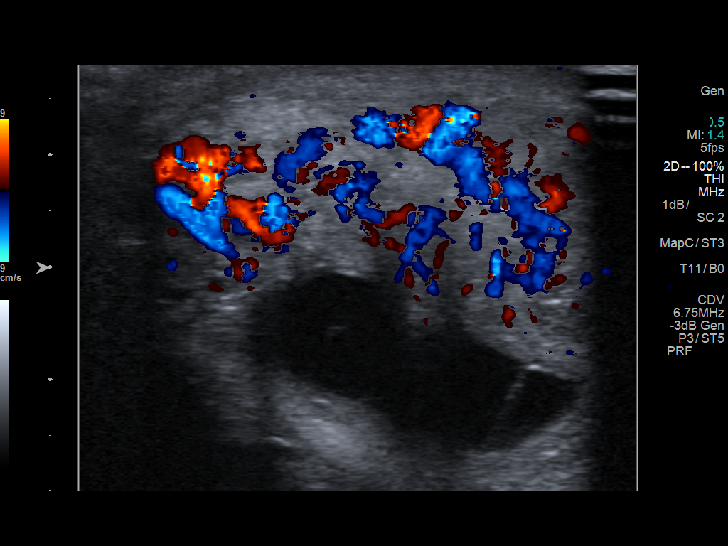
[im 20/60]
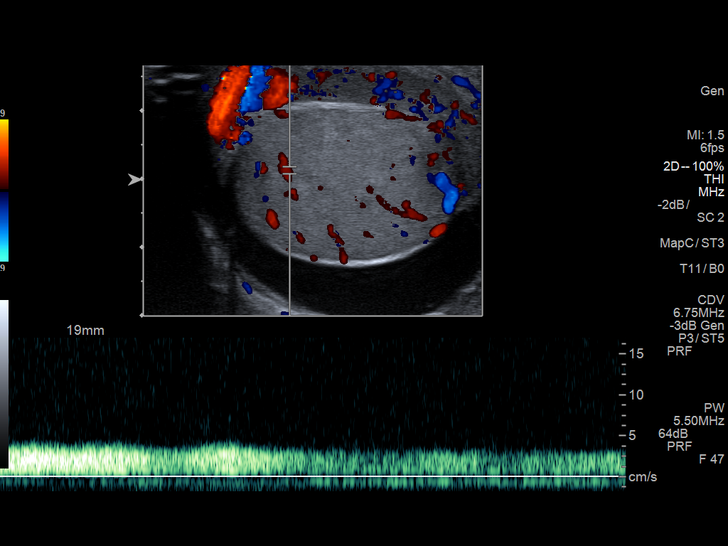
[im 25/60]
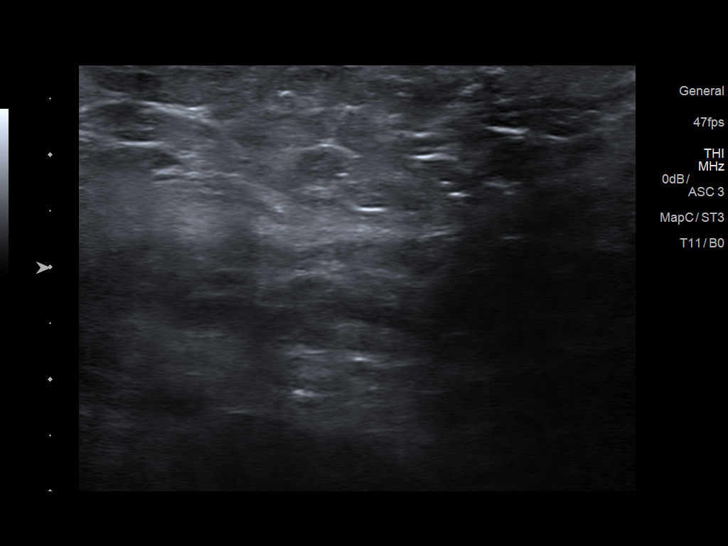
[im 30/60]
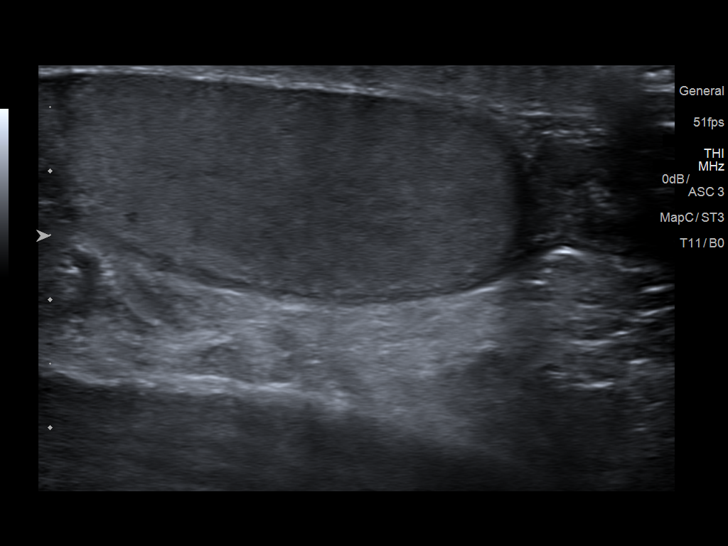
[im 35/60]
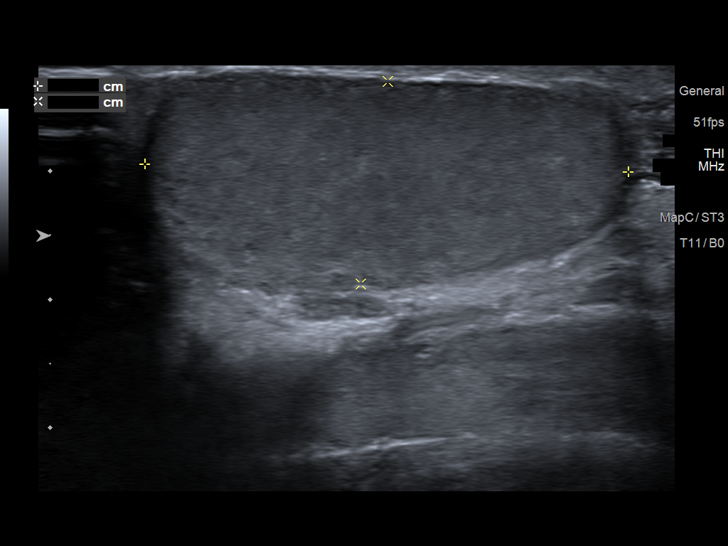
[im 40/60]
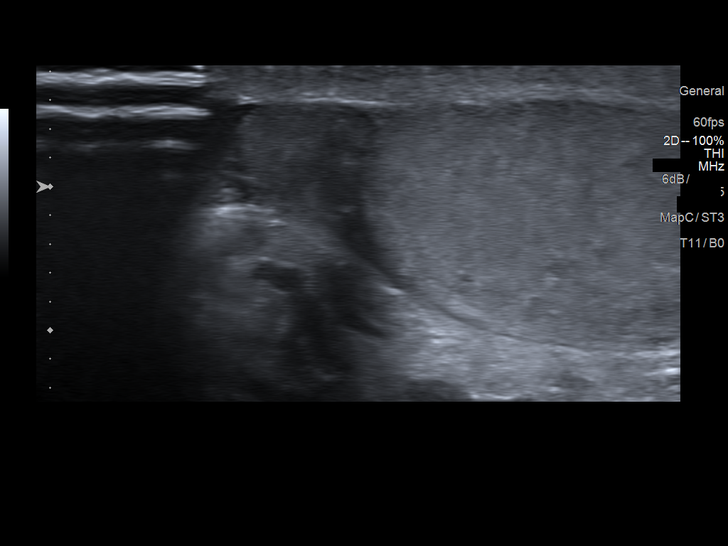
[im 45/60]
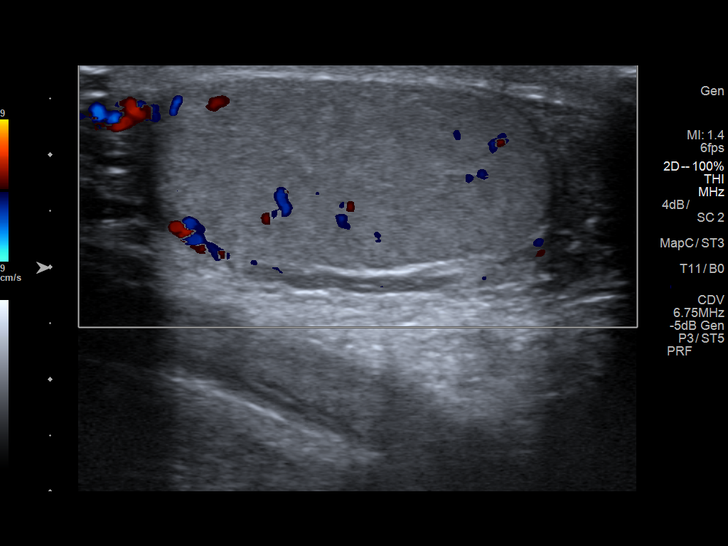
[im 50/60]
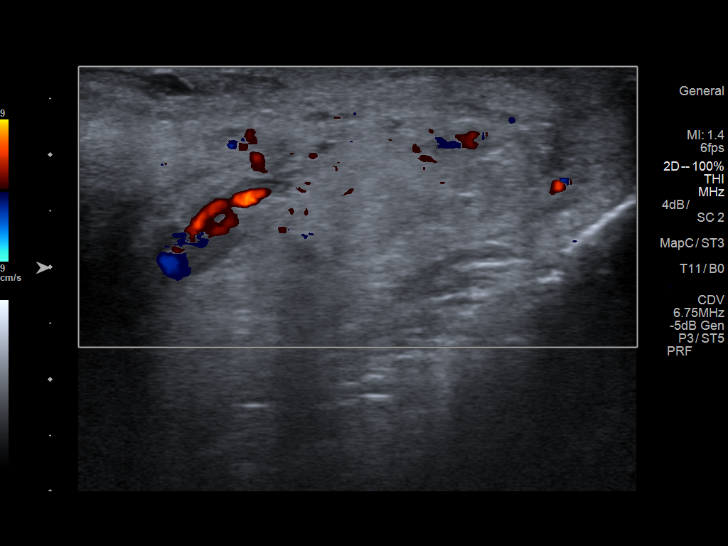
[im 55/60]
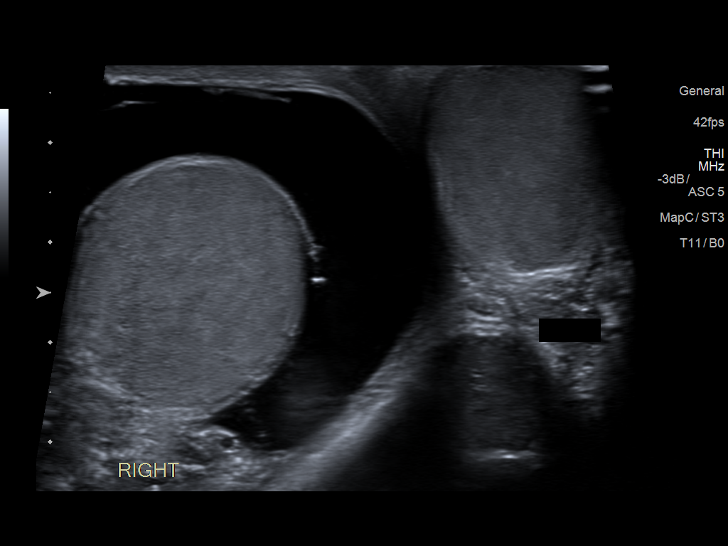
[im 60/60]
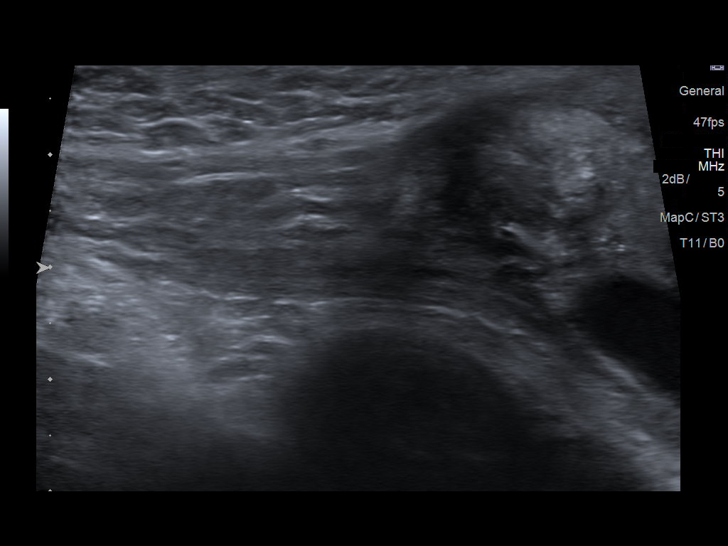

[13 of 25 positions shown; findings below may reference images not displayed]

FINDINGS: Right testicle

Measurements: 3.4 x 2.5 x 2.7 cm.. No mass or microlithiasis
visualized.

Left testicle

Measurements: 3.8 x 1.6 x 2.8 cm. No mass or microlithiasis
visualized.

Right epididymis: The right epididymis is difficult to separate from
a large soft tissue abnormality extending up to the inguinal canal.
This is felt to be most likely related to varicocele. This shows
hypervascularity but does not change significantly on Valsalva.
Epididymal infection or tumor considered less likely.

Left epididymis:  Normal in size and appearance.

Hydrocele: Large septated hydrocele on the right has enlarged in the
interval.

Varicocele: Hypervascular mass in the right scrotum above the
testicle extending into the inguinal canal. This shows serpiginous
enlarged vessels but does not change significantly with Valsalva.
This most likely is a varicocele. Hypervascular inflammatory or
neoplastic mass of the epididymis considered less likely. Similar
appearance to the prior ultrasound. Correlate with physical exam.

Pulsed Doppler interrogation of both testes demonstrates normal low
resistance arterial and venous waveforms bilaterally.
IMPRESSION: Progression of hydrocele on the right. Negative for testicular
torsion

Soft tissue thickening above the right testicle extending into the
inguinal canal shows hypervascularity and prominent enlarged vessels
but does not change significantly with Valsalva. This may represent
varicocele possibly with partial thrombosis and tissue edema.
Epididymal infection or tumor considered less likely.
